# Patient Record
Sex: Male | Born: 1995 | Race: White | Hispanic: No | Marital: Single | Smoking: Never smoker
Health system: Southern US, Community
[De-identification: ages and names within clinical notes are randomized; demographics above are authoritative.]

## PROBLEM LIST (undated history)

## (undated) DIAGNOSIS — E669 Obesity, unspecified: Secondary | ICD-10-CM

## (undated) HISTORY — PX: KNEE SURGERY: SHX244

## (undated) HISTORY — PX: TYMPANOSTOMY TUBE PLACEMENT: SHX32

## (undated) HISTORY — PX: TONSILLECTOMY: SUR1361

---

## 2015-05-01 ENCOUNTER — Emergency Department
Admission: EM | Admit: 2015-05-01 | Discharge: 2015-05-01 | Disposition: A | Discharge status: Home / Self Care | Payer: 59 | Attending: Emergency Medicine | Admitting: Emergency Medicine

## 2015-05-01 ENCOUNTER — Encounter: Payer: Self-pay | Admitting: *Deleted

## 2015-05-01 DIAGNOSIS — R197 Diarrhea, unspecified: Secondary | ICD-10-CM | POA: Diagnosis not present

## 2015-05-01 DIAGNOSIS — R112 Nausea with vomiting, unspecified: Secondary | ICD-10-CM | POA: Insufficient documentation

## 2015-05-01 HISTORY — DX: Obesity, unspecified: E66.9

## 2015-05-01 LAB — URINALYSIS COMPLETE WITH MICROSCOPIC (ARMC ONLY)
BACTERIA UA: NONE SEEN
BILIRUBIN URINE: NEGATIVE
GLUCOSE, UA: NEGATIVE mg/dL
HGB URINE DIPSTICK: NEGATIVE
Ketones, ur: NEGATIVE mg/dL
LEUKOCYTES UA: NEGATIVE
NITRITE: NEGATIVE
Protein, ur: NEGATIVE mg/dL
Specific Gravity, Urine: 1.021 (ref 1.005–1.030)
pH: 6 (ref 5.0–8.0)

## 2015-05-01 MED ORDER — ONDANSETRON 4 MG PO TBDP
ORAL_TABLET | ORAL | Status: AC
  Filled 2015-05-01: qty 1

## 2015-05-01 MED ORDER — ONDANSETRON HCL 4 MG PO TABS: 4.0000 mg | ORAL_TABLET | Freq: Every day | ORAL | Status: AC | PRN

## 2015-05-01 MED ORDER — ONDANSETRON 4 MG PO TBDP
4.0000 mg | ORAL_TABLET | Freq: Once | ORAL | Status: AC
  Administered 2015-05-01: 4 mg via ORAL

## 2015-05-01 NOTE — ED Notes (Signed)
Pt here with vomiting and diarrhea since 1am. Significant other has same symptoms and was told that its a stomach bug.  Pt has been able to tolerate some fluids.

## 2015-05-01 NOTE — Discharge Instructions (Signed)
Take Zofran, ondansetron, if needed for further nausea. He keeps slipping on fluids. Eat small meals for the next day of healthy simple foods. Avoid meat and grease. Follow-up with [facility] clinic or another doctor if you have ongoing problems. If he can't keep fluids down or if you have abdominal pain or other urgent concerns, return to the emergency department.  Nausea and Vomiting Nausea is a sick feeling that often comes before throwing up (vomiting). Vomiting is a reflex where stomach contents come out of your mouth. Vomiting can cause severe loss of body fluids (dehydration). Children and elderly adults can become dehydrated quickly, especially if they also have diarrhea. Nausea and vomiting are symptoms of a condition or disease. It is important to find the cause of your symptoms. CAUSES   Direct irritation of the stomach lining. This irritation can result from increased acid production (gastroesophageal reflux disease), infection, food poisoning, taking certain medicines (such as nonsteroidal anti-inflammatory drugs), alcohol use, or tobacco use.  Signals from the brain.These signals could be caused by a headache, heat exposure, an inner ear disturbance, increased pressure in the brain from injury, infection, a tumor, or a concussion, pain, emotional stimulus, or metabolic problems.  An obstruction in the gastrointestinal tract (bowel obstruction).  Illnesses such as diabetes, hepatitis, gallbladder problems, appendicitis, kidney problems, cancer, sepsis, atypical symptoms of a heart attack, or eating disorders.  Medical treatments such as chemotherapy and radiation.  Receiving medicine that makes you sleep (general anesthetic) during surgery. DIAGNOSIS Your caregiver may ask for tests to be done if the problems do not improve after a few days. Tests may also be done if symptoms are severe or if the reason for the nausea and vomiting is not clear. Tests may include:  Urine  tests.  Blood tests.  Stool tests.  Cultures (to look for evidence of infection).  X-rays or other imaging studies. Test results can help your caregiver make decisions about treatment or the need for additional tests. TREATMENT You need to stay well hydrated. Drink frequently but in small amounts.You may wish to drink water, sports drinks, clear broth, or eat frozen ice pops or gelatin dessert to help stay hydrated.When you eat, eating slowly may help prevent nausea.There are also some antinausea medicines that may help prevent nausea. HOME CARE INSTRUCTIONS   Take all medicine as directed by your caregiver.  If you do not have an appetite, do not force yourself to eat. However, you must continue to drink fluids.  If you have an appetite, eat a normal diet unless your caregiver tells you differently.  Eat a variety of complex carbohydrates (rice, wheat, potatoes, bread), lean meats, yogurt, fruits, and vegetables.  Avoid high-fat foods because they are more difficult to digest.  Drink enough water and fluids to keep your urine clear or pale yellow.  If you are dehydrated, ask your caregiver for specific rehydration instructions. Signs of dehydration may include:  Severe thirst.  Dry lips and mouth.  Dizziness.  Dark urine.  Decreasing urine frequency and amount.  Confusion.  Rapid breathing or pulse. SEEK IMMEDIATE MEDICAL CARE IF:   You have blood or brown flecks (like coffee grounds) in your vomit.  You have black or bloody stools.  You have a severe headache or stiff neck.  You are confused.  You have severe abdominal pain.  You have chest pain or trouble breathing.  You do not urinate at least once every 8 hours.  You develop cold or clammy skin.  You continue  to vomit for longer than 24 to 48 hours.  You have a fever. MAKE SURE YOU:   Understand these instructions.  Will watch your condition.  Will get help right away if you are not doing  well or get worse. Document Released: 10/25/2005 Document Revised: 01/17/2012 Document Reviewed: 03/24/2011 [facility] Patient Information 2015 [place], [place]. This information is not intended to replace advice given to you by your health care provider. Make sure you discuss any questions you have with your health care provider.

## 2015-05-01 NOTE — ED Provider Notes (Signed)
[facility] Emergency Department Provider Note  ____________________________________________  Time seen: 2125 I have reviewed the triage vital signs and the nursing notes.   HISTORY  Chief Complaint Emesis  diarrhea    HPI Maurice Woods is a 19 y.o. male   who was at work last night when he began feeling nauseous and went to the bathroom and vomited 4:15 to 20 minutes. He has had diarrhea as well. His significant other, who is pregnant, is having similar symptoms that began at the same time.   [name] continued to have nausea and vomiting today but he has been able to drink and hold down fluids. He reports the last time he vomited was around 2:00pm. He denies abdominal pain except for the mild discomfort that often comes with emesis. He has no fever and no shortness of breath.    Past Medical History  Diagnosis Date  . Obesity     There are no active problems to display for this patient.   Past Surgical History  Procedure Laterality Date  . Knee surgery    . Tonsillectomy      Current Outpatient Rx  Name  Route  Sig  Dispense  Refill  . ondansetron (ZOFRAN) 4 MG tablet   Oral   Take 1 tablet (4 mg total) by mouth daily as needed for nausea or vomiting.   10 tablet   0     Allergies Review of patient's allergies indicates no known allergies.  No family history on file.  Social History History  Substance Use Topics  . Smoking status: Never Smoker   . Smokeless tobacco: Not on file  . Alcohol Use: No    Review of Systems  Constitutional: Negative for fever. ENT: Negative for sore throat. Cardiovascular: Negative for chest pain. Respiratory: Negative for shortness of breath. Gastrointestinal: Nausea vomiting diarrhea. See history of present illness Genitourinary: Negative for dysuria. Musculoskeletal: No myalgias or injuries. Skin: Negative for rash. Neurological: Negative for headaches   10-point ROS otherwise  negative.  ____________________________________________   PHYSICAL EXAM:  VITAL SIGNS: ED Triage Vitals  Enc Vitals Group     BP 05/01/15 2019 134/66 mmHg     Pulse Rate 05/01/15 2019 108     Resp 05/01/15 2019 20     Temp 05/01/15 2019 99 F (37.2 C)     Temp Source 05/01/15 2019 Oral     SpO2 05/01/15 2019 96 %     Weight 05/01/15 2019 325 lb (147.419 kg)     Height 05/01/15 2019 6\' 3"  (1.905 m)     Head Cir --      Peak Flow --      Pain Score 05/01/15 2020 6     Pain Loc --      Pain Edu? --      Excl. in GC? --     Constitutional: Alert and oriented. Well appearing and in no distress. ENT   Head: Normocephalic and atraumatic.   Nose: No congestion/rhinnorhea.   Mouth/Throat: Mucous membranes are moist. Cardiovascular: Normal rate at 90, regular rhythm, no murmur noted Respiratory:  Normal respiratory effort, no tachypnea.    Breath sounds are clear and equal bilaterally.  Gastrointestinal: Soft and nontender - no focal pain. No distention.  Back: No muscle spasm, no tenderness, no CVA tenderness. Musculoskeletal: No deformity noted. Nontender with normal range of motion in all extremities.  No noted edema. Neurologic:  Normal speech and language. No gross focal neurologic deficits are appreciated.  Skin:  Skin is warm, dry. No rash noted. Psychiatric: Mood and affect are normal. Speech and behavior are normal.  ____________________________________________    LABS (pertinent positives/negatives)  Urinalysis: Negative for infection. No ketones.  ____________________________________________ ____________________________________________   INITIAL IMPRESSION / ASSESSMENT AND PLAN / ED COURSE  Well-appearing 19 year old male with nausea vomiting diarrhea. He has not vomited in a couple of hours. He says he has minimal nausea at this time. We will treat him with Zofran ODT and began an oral challenge. If he is able to tolerate fluids we'll let him go home  with a prescription for additional Zofran.  ____________________________________________   FINAL CLINICAL IMPRESSION(S) / ED DIAGNOSES  Final diagnoses:  Nausea vomiting and diarrhea      Darien Ramus, MD 05/01/15 [phone]

## 2016-01-14 ENCOUNTER — Emergency Department
Admission: EM | Admit: 2016-01-14 | Discharge: 2016-01-14 | Disposition: A | Discharge status: Home / Self Care | Payer: Self-pay | Attending: Emergency Medicine | Admitting: Emergency Medicine

## 2016-01-14 DIAGNOSIS — J069 Acute upper respiratory infection, unspecified: Secondary | ICD-10-CM | POA: Insufficient documentation

## 2016-01-14 DIAGNOSIS — H65191 Other acute nonsuppurative otitis media, right ear: Secondary | ICD-10-CM | POA: Insufficient documentation

## 2016-01-14 MED ORDER — AMOXICILLIN 500 MG PO CAPS: 500.0000 mg | ORAL_CAPSULE | Freq: Two times a day (BID) | ORAL | Status: AC

## 2016-01-14 NOTE — ED Provider Notes (Signed)
[facility] Emergency Department Provider Note  ____________________________________________  Time seen: Approximately 8:44 PM  I have reviewed the triage vital signs and the nursing notes.   HISTORY  Chief Complaint Otalgia    HPI Maurice Woods is a 20 y.o. male with a history of recurrent otitis media as a child, presenting with right ear pain in the setting of a nonproductive cough, congestion and rhinorrhea. No sore throat, shortness of breath. Positive muffled sounds. Patient has tried putting peroxide in his ear without any improvement.   Past Medical History  Diagnosis Date  . Obesity     There are no active problems to display for this patient.   Past Surgical History  Procedure Laterality Date  . Knee surgery    . Tonsillectomy    . Tympanostomy tube placement      Current Outpatient Rx  Name  Route  Sig  Dispense  Refill  . ondansetron (ZOFRAN) 4 MG tablet   Oral   Take 1 tablet (4 mg total) by mouth daily as needed for nausea or vomiting.   10 tablet   0     Allergies Review of patient's allergies indicates no known allergies.  History reviewed. No pertinent family history.  Social History Social History  Substance Use Topics  . Smoking status: Never Smoker   . Smokeless tobacco: None  . Alcohol Use: No    Review of Systems Constitutional: No fever/chills. No lightheadedness or syncopal. Eyes: No visual changes. No eye discharge. ENT: No sore throat. Positive congestion and rhinorrhea. Positive right ear pain with muffled sounds. Cardiovascular: Denies chest pain, palpitations. Respiratory: Denies shortness of breath.  Positive cough. Gastrointestinal: No abdominal pain.  No nausea, no vomiting.  No diarrhea.  No constipation. Genitourinary: Negative for dysuria. Musculoskeletal: Negative for back pain. Skin: Negative for rash. Neurological: Negative for headaches, focal weakness or numbness.  10-point ROS  otherwise negative.  ____________________________________________   PHYSICAL EXAM:  VITAL SIGNS: ED Triage Vitals  Enc Vitals Group     BP 01/14/16 2016 150/81 mmHg     Pulse Rate 01/14/16 2016 88     Resp 01/14/16 2016 17     Temp 01/14/16 2016 97.9 F (36.6 C)     Temp Source 01/14/16 2016 Oral     SpO2 01/14/16 2016 98 %     Weight 01/14/16 2016 325 lb (147.419 kg)     Height 01/14/16 2016  (1.88 m)     Head Cir --      Peak Flow --      Pain Score 01/14/16 2017 9     Pain Loc --      Pain Edu? --      Excl. in GC? --     Constitutional: Alert and oriented. Well appearing and in no acute distress. Answer question appropriately. Eyes: Conjunctivae are normal.  EOMI. no eye discharge. EARS: Left TM is clear without bulge, erythema or fluid. Right TM has diffuse erythema with fluid behind the ear. No evidence of perforation. Canals are clear bilaterally. Head: Atraumatic. Nose: Positive congestion without rhinorrhea. Mouth/Throat: Mucous membranes are moist.  Neck: No stridor.  Supple.  No submandibular lymphadenopathy. Cardiovascular: Normal rate, regular rhythm. No murmurs, rubs or gallops.  Respiratory: Normal respiratory effort.  No retractions. Lungs CTAB.  No wheezes, rales or ronchi. Musculoskeletal: No LE edema.  Neurologic:  Normal speech and language. No gross focal neurologic deficits are appreciated.  Skin:  Skin is warm, dry and  intact. No rash noted. Psychiatric: Mood and affect are normal. Speech and behavior are normal.  Normal judgement.  ____________________________________________   LABS (all labs ordered are listed, but only abnormal results are displayed)  Labs Reviewed - No data to display ____________________________________________  EKG  Not indicated ____________________________________________  RADIOLOGY  No results found.  ____________________________________________   PROCEDURES  Procedure(s) performed: None  Critical  Care performed: No ____________________________________________   INITIAL IMPRESSION / ASSESSMENT AND PLAN / ED COURSE  Pertinent labs & imaging results that were available during my care of the patient were reviewed by me and considered in my medical decision making (see chart for details).  20 y.o. male with URI symptoms and right ear pain, findings consistent with otitis media on the right. Will plan to discharge him home with antibiotics and have him follow-up with his primary care physician. Return precautions were discussed.  ____________________________________________  FINAL CLINICAL IMPRESSION(S) / ED DIAGNOSES  Final diagnoses:  Other acute nonsuppurative otitis media of right ear  URI, acute      NEW MEDICATIONS STARTED DURING THIS VISIT:  New Prescriptions   No medications on file     Rockne MenghiniAnne-Caroline Elisandro Jarrett, MD 01/14/16 2048

## 2016-01-14 NOTE — Discharge Instructions (Signed)
Please return to the emergency department if you develop worsening pain, fever, inability to keep down fluids, shortness breath, or any other symptoms concerning to you.

## 2016-01-14 NOTE — ED Notes (Signed)
Pt drove self

## 2016-01-14 NOTE — ED Notes (Deleted)
Pt reports right flank pain since Friday. Pt denies abdominal pain or dysuria

## 2016-01-14 NOTE — ED Notes (Signed)
Pt arrived to ED with c/o right ear pain that began on Sunday. Pt reports increased pain today at 4 pm.

## 2016-01-25 ENCOUNTER — Emergency Department: Payer: Self-pay

## 2016-01-25 DIAGNOSIS — H6693 Otitis media, unspecified, bilateral: Secondary | ICD-10-CM | POA: Insufficient documentation

## 2016-01-25 DIAGNOSIS — J209 Acute bronchitis, unspecified: Secondary | ICD-10-CM | POA: Insufficient documentation

## 2016-01-25 LAB — COMPREHENSIVE METABOLIC PANEL
ALBUMIN: 4.2 g/dL (ref 3.5–5.0)
ALK PHOS: 125 U/L (ref 38–126)
ALT: 62 U/L (ref 17–63)
AST: 37 U/L (ref 15–41)
Anion gap: 5 (ref 5–15)
BILIRUBIN TOTAL: 0.8 mg/dL (ref 0.3–1.2)
BUN: 15 mg/dL (ref 6–20)
CALCIUM: 9.3 mg/dL (ref 8.9–10.3)
CO2: 26 mmol/L (ref 22–32)
Chloride: 104 mmol/L (ref 101–111)
Creatinine, Ser: 1.3 mg/dL — ABNORMAL HIGH (ref 0.61–1.24)
GFR calc Af Amer: >60 mL/min (ref >60)
Glucose, Bld: 117 mg/dL — ABNORMAL HIGH (ref 65–99)
Potassium: 4.4 mmol/L (ref 3.5–5.1)
Sodium: 135 mmol/L (ref 135–145)
TOTAL PROTEIN: 8.1 g/dL (ref 6.5–8.1)

## 2016-01-25 LAB — CBC
HEMATOCRIT: 50.6 % (ref 40.0–52.0)
HEMOGLOBIN: 17.2 g/dL (ref 13.0–18.0)
MCH: 29.7 pg (ref 26.0–34.0)
MCHC: 34.1 g/dL (ref 32.0–36.0)
MCV: 87 fL (ref 80.0–100.0)
Platelets: 281 10*3/uL (ref 150–440)
RBC: 5.81 MIL/uL (ref 4.40–5.90)
RDW: 12.8 % (ref 11.5–14.5)
WBC: 11.8 10*3/uL — AB (ref 3.8–10.6)

## 2016-01-25 LAB — TROPONIN I: Troponin I: <0.03 ng/mL (ref <0.031)

## 2016-01-25 MED ORDER — IPRATROPIUM-ALBUTEROL 0.5-2.5 (3) MG/3ML IN SOLN
RESPIRATORY_TRACT | Status: AC
  Filled 2016-01-25: qty 3

## 2016-01-25 MED ORDER — IPRATROPIUM-ALBUTEROL 0.5-2.5 (3) MG/3ML IN SOLN
3.0000 mL | Freq: Once | RESPIRATORY_TRACT | Status: AC
  Administered 2016-01-25: 3 mL via RESPIRATORY_TRACT

## 2016-01-25 NOTE — ED Notes (Signed)
Pt states that he started feeling bad on Monday, states cough, states that he has also been having a headache, pt states that he woke up to get ready for work and started coughing a lot, pt states that he felt like he was going to pass out and states he started having a sharp stabbing pain in the center of his chest, pt reports asthma as a child, last episode approx 10 years ago

## 2016-01-26 ENCOUNTER — Emergency Department
Admission: EM | Admit: 2016-01-26 | Discharge: 2016-01-26 | Disposition: A | Discharge status: Home / Self Care | Payer: Self-pay | Attending: Emergency Medicine | Admitting: Emergency Medicine

## 2016-01-26 DIAGNOSIS — J40 Bronchitis, not specified as acute or chronic: Secondary | ICD-10-CM

## 2016-01-26 DIAGNOSIS — R05 Cough: Secondary | ICD-10-CM

## 2016-01-26 DIAGNOSIS — H6693 Otitis media, unspecified, bilateral: Secondary | ICD-10-CM

## 2016-01-26 DIAGNOSIS — R059 Cough, unspecified: Secondary | ICD-10-CM

## 2016-01-26 MED ORDER — LORATADINE 10 MG PO TABS: 10.0000 mg | ORAL_TABLET | Freq: Every day | ORAL | Status: AC | PRN

## 2016-01-26 MED ORDER — AMOXICILLIN-POT CLAVULANATE 875-125 MG PO TABS: 1.0000 | ORAL_TABLET | Freq: Two times a day (BID) | ORAL | Status: AC

## 2016-01-26 MED ORDER — AMOXICILLIN-POT CLAVULANATE 875-125 MG PO TABS
1.0000 | ORAL_TABLET | Freq: Once | ORAL | Status: AC
  Administered 2016-01-26: 1 via ORAL
  Filled 2016-01-26: qty 1

## 2016-01-26 MED ORDER — ALBUTEROL SULFATE HFA 108 (90 BASE) MCG/ACT IN AERS: 2.0000 | INHALATION_SPRAY | Freq: Four times a day (QID) | RESPIRATORY_TRACT | Status: AC | PRN

## 2016-01-26 MED ORDER — AZITHROMYCIN 500 MG PO TABS
500.0000 mg | ORAL_TABLET | Freq: Once | ORAL | Status: AC
  Administered 2016-01-26: 500 mg via ORAL
  Filled 2016-01-26: qty 1

## 2016-01-26 MED ORDER — IPRATROPIUM-ALBUTEROL 0.5-2.5 (3) MG/3ML IN SOLN
3.0000 mL | Freq: Once | RESPIRATORY_TRACT | Status: AC
  Administered 2016-01-26: 3 mL via RESPIRATORY_TRACT
  Filled 2016-01-26: qty 3

## 2016-01-26 NOTE — Discharge Instructions (Signed)
Return to the emergency room for shortness of breath or other new or worrisome symptoms including increased pain around your ears. Fever stiff neck or you feel worse in any way, follow closely with ENT for your ear infections or they could get significantly worse. Allergies An allergy is an abnormal reaction to a substance by the body's defense system (immune system). Allergies can develop at any age. WHAT CAUSES ALLERGIES? An allergic reaction happens when the immune system mistakenly reacts to a normally harmless substance, called an allergen, as if it were harmful. The immune system releases antibodies to fight the substance. Antibodies eventually release a chemical called histamine into the bloodstream. The release of histamine is meant to protect the body from infection, but it also causes discomfort. An allergic reaction can be triggered by:  Eating an allergen.  Inhaling an allergen.  Touching an allergen. WHAT TYPES OF ALLERGIES ARE THERE? There are many types of allergies. Common types include:  Seasonal allergies. People with this type of allergy are usually allergic to substances that are only present during certain seasons, such as molds and pollens.  Food allergies.  Drug allergies.  Insect allergies.  Animal dander allergies. WHAT ARE SYMPTOMS OF ALLERGIES? Possible allergy symptoms include:  Swelling of the lips, face, tongue, mouth, or throat.  Sneezing, coughing, or wheezing.  Nasal congestion.  Tingling in the mouth.  Rash.  Itching.  Itchy, red, swollen areas of skin (hives).  Watery eyes.  Vomiting.  Diarrhea.  Dizziness.  Lightheadedness.  Fainting.  Trouble breathing or swallowing.  Chest tightness.  Rapid heartbeat. HOW ARE ALLERGIES DIAGNOSED? Allergies are diagnosed with a medical and family history and one or more of the following:  Skin tests.  Blood tests.  A food diary. A food diary is a record of all the foods and drinks  you have in a day and of all the symptoms you experience.  The results of an elimination diet. An elimination diet involves eliminating foods from your diet and then adding them back in one by one to find out if a certain food causes an allergic reaction. HOW ARE ALLERGIES TREATED? There is no cure for allergies, but allergic reactions can be treated with medicine. Severe reactions usually need to be treated at a hospital. HOW CAN REACTIONS BE PREVENTED? The best way to prevent an allergic reaction is by avoiding the substance you are allergic to. Allergy shots and medicines can also help prevent reactions in some cases. People with severe allergic reactions may be able to prevent a life-threatening reaction called anaphylaxis with a medicine given right after exposure to the allergen.   This information is not intended to replace advice given to you by your health care provider. Make sure you discuss any questions you have with your health care provider.   Document Released: 01/18/2003 Document Revised: 11/15/2014 Document Reviewed: 08/06/2014 Elsevier Interactive Patient Education 2016 [employer]  Cough, Adult Coughing is a reflex that clears your throat and your airways. Coughing helps to heal and protect your lungs. It is normal to cough occasionally, but a cough that happens with other symptoms or lasts a long time may be a sign of a condition that needs treatment. A cough may last only 2-3 weeks (acute), or it may last longer than 8 weeks (chronic). CAUSES Coughing is commonly caused by:  Breathing in substances that irritate your lungs.  A viral or bacterial respiratory infection.  Allergies.  Asthma.  Postnasal drip.  Smoking.  Acid backing up  from the stomach into the esophagus (gastroesophageal reflux).  Certain medicines.  Chronic lung problems, including COPD (or rarely, lung cancer).  Other medical conditions such as heart failure. HOME CARE INSTRUCTIONS  Pay  attention to any changes in your symptoms. Take these actions to help with your discomfort:  Take medicines only as told by your health care provider.  If you were prescribed an antibiotic medicine, take it as told by your health care provider. Do not stop taking the antibiotic even if you start to feel better.  Talk with your health care provider before you take a cough suppressant medicine.  Drink enough fluid to keep your urine clear or pale yellow.  If the air is dry, use a cold steam vaporizer or humidifier in your bedroom or your home to help loosen secretions.  Avoid anything that causes you to cough at work or at home.  If your cough is worse at night, try sleeping in a semi-upright position.  Avoid cigarette smoke. If you smoke, quit smoking. If you need help quitting, ask your health care provider.  Avoid caffeine.  Avoid alcohol.  Rest as needed. SEEK MEDICAL CARE IF:   You have new symptoms.  You cough up pus.  Your cough does not get better after 2-3 weeks, or your cough gets worse.  You cannot control your cough with suppressant medicines and you are losing sleep.  You develop pain that is getting worse or pain that is not controlled with pain medicines.  You have a fever.  You have unexplained weight loss.  You have night sweats. SEEK IMMEDIATE MEDICAL CARE IF:  You cough up blood.  You have difficulty breathing.  Your heartbeat is very fast.   This information is not intended to replace advice given to you by your health care provider. Make sure you discuss any questions you have with your health care provider.   Document Released: 04/23/2011 Document Revised: 07/16/2015 Document Reviewed: 01/01/2015 Elsevier Interactive Patient Education [employer]

## 2016-01-26 NOTE — ED Provider Notes (Addendum)
[facility] Emergency Department Provider Note  ____________________________________________   I have reviewed the triage vital signs and the nursing notes.   HISTORY  Chief Complaint Cough    HPI Maurice Woods is a 20 y.o. male who was recently treated for an otitis media. Does have a history of recurrent otitis media. He was given amoxicillin however he states he did not "take them all". He is unsure how many he took. "Maybe a week or maybe less.". In any event, he has developeda cough, he states his ears are still somewhat clogged but now he is complaining of a cough which is significant. His of seasonal allergies as not taking anything for it. He used to have asthma but no longer takes any inhalers. He is here now because of the cough. He did receive an albuterol neb. Although there are low oxygen saturations noted in the triage note, when I'm in the room is 98-90%. He states he feels much better after a single nebulizer. He has not had any fever, he does not have any increased ear pain. He states he still feels that there is fluid behind his ears however.  Past Medical History  Diagnosis Date  . Obesity     There are no active problems to display for this patient.   Past Surgical History  Procedure Laterality Date  . Knee surgery    . Tonsillectomy    . Tympanostomy tube placement      Current Outpatient Rx  Name  Route  Sig  Dispense  Refill  . amoxicillin (AMOXIL) 500 MG capsule   Oral   Take 1 capsule (500 mg total) by mouth 2 (two) times daily.   20 capsule   0   . ondansetron (ZOFRAN) 4 MG tablet   Oral   Take 1 tablet (4 mg total) by mouth daily as needed for nausea or vomiting. Patient not taking: Reported on 01/25/2016   10 tablet   0     Allergies Review of patient's allergies indicates no known allergies.  No family history on file.  Social History Social History  Substance Use Topics  . Smoking status: Never  Smoker   . Smokeless tobacco: Not on file  . Alcohol Use: No    Review of Systems Constitutional: No fever/chills Eyes: No visual changes. ENT: No sore throat. No stiff neck no neck pain Cardiovascular: Denies chest pain. Respiratory: Positive shortness of breath. Gastrointestinal:   no vomiting.  No diarrhea.  No constipation. Genitourinary: Negative for dysuria. Musculoskeletal: Negative lower extremity swelling Skin: Negative for rash. Neurological: Negative for headaches, focal weakness or numbness. 10-point ROS otherwise negative.  ____________________________________________   PHYSICAL EXAM:  VITAL SIGNS: ED Triage Vitals  Enc Vitals Group     BP 01/25/16 2255 134/95 mmHg     Pulse Rate 01/25/16 2255 108     Resp 01/25/16 2255 20     Temp 01/25/16 2255 98.1 F (36.7 C)     Temp Source 01/25/16 2255 Oral     SpO2 01/25/16 2255 92 %     Weight 01/25/16 2255 325 lb (147.419 kg)     Height 01/25/16 2255  (1.88 m)     Head Cir --      Peak Flow --      Pain Score 01/25/16 2256 7     Pain Loc --      Pain Edu? --      Excl. in GC? --  Constitutional: Alert and oriented. Well appearing and in no acute distress. He is using telephone with his legs crossed Eyes: Conjunctivae are normal. PERRL. EOMI. Head: Atraumatic. Nose: Positive mild congestion/rhinnorhea. Both eardrums are erythematous with fluid behind them no evidence of perforation or mastoiditis Mouth/Throat: Mucous membranes are moist.  Oropharynx non-erythematous. Neck: No stridor.   Nontender with no meningismus Cardiovascular: Normal rate, regular rhythm. Grossly normal heart sounds.  Good peripheral circulation. Respiratory: Normal respiratory effort.  No retractions. Lungs CTAB. Abdominal: Soft and nontender. No distention. No guarding no rebound Back:  There is no focal tenderness or step off there is no midline tenderness there are no lesions noted. there is no CVA tenderness Musculoskeletal:  No lower extremity tenderness. No joint effusions, no DVT signs strong distal pulses no edema Neurologic:  Normal speech and language. No gross focal neurologic deficits are appreciated.  Skin:  Skin is warm, dry and intact. No rash noted. Psychiatric: Mood and affect are normal. Speech and behavior are normal.  ____________________________________________   LABS (all labs ordered are listed, but only abnormal results are displayed)  Labs Reviewed  CBC - Abnormal; Notable for the following:    WBC 11.8 (*)    All other components within normal limits  COMPREHENSIVE METABOLIC PANEL - Abnormal; Notable for the following:    Glucose, Bld 117 (*)    Creatinine, Ser 1.30 (*)    All other components within normal limits  TROPONIN I   ____________________________________________  EKG  I personally interpreted any EKGs ordered by me or triage  ____________________________________________  RADIOLOGY  I reviewed any imaging ordered by me or triage that were performed during my shift and, if possible, patient and/or family made aware of any abnormal findings. ____________________________________________   PROCEDURES  Procedure(s) performed: None  Critical Care performed: None  ____________________________________________   INITIAL IMPRESSION / ASSESSMENT AND PLAN / ED COURSE  Pertinent labs & imaging results that were available during my care of the patient were reviewed by me and considered in my medical decision making (see chart for details).  Patient states he feels almost better like another neb before he goes home. He does have a recurrent otitis media. Is noncompliant with the antibiotic. Given ongoing ear infection and will avoid steroids as I do not think they're indicated. I will restart him on Augmentin. This will be for his ears not for his likely viral cough. We will refer him to ENT. I strongly encouraged him to take all the antibiotics this time. His oxygen  saturation like I said is documented 92-93 but when I'm in the room and get a good waveform as opposed to what appears to be documented, the patient's sats are 98-99.  ____________________________________________   FINAL CLINICAL IMPRESSION(S) / ED DIAGNOSES  Final diagnoses:  None      This chart was dictated using voice recognition software.  Despite best efforts to proofread,  errors can occur which can change meaning.      Jeanmarie PlantJames A Agron Swiney, MD 01/26/16 0157  Jeanmarie PlantJames A Hebert Dooling, MD 01/26/16 [phone]

## 2016-01-26 NOTE — ED Notes (Signed)
Pt states "days" of cough, chest tightness with coughing, feeling shob with coughing. Pt with hoarse voice noted. Skin pwd, resps unlabored. Pt states "i feel a little better" after duoneb. Pt states "i still don't feel all better."

## 2017-04-09 IMAGING — CR DG CHEST 2V
1 series · 2 of 2 positions shown · non-contrast
Comparison: None.

CLINICAL DATA: Cough and dyspnea, onset this evening.

EXAM:
CHEST  2 VIEW

[Series 1: dg chest 2 view · 0.14mm/px · 2 of 2 slices shown]
[im 1/2]
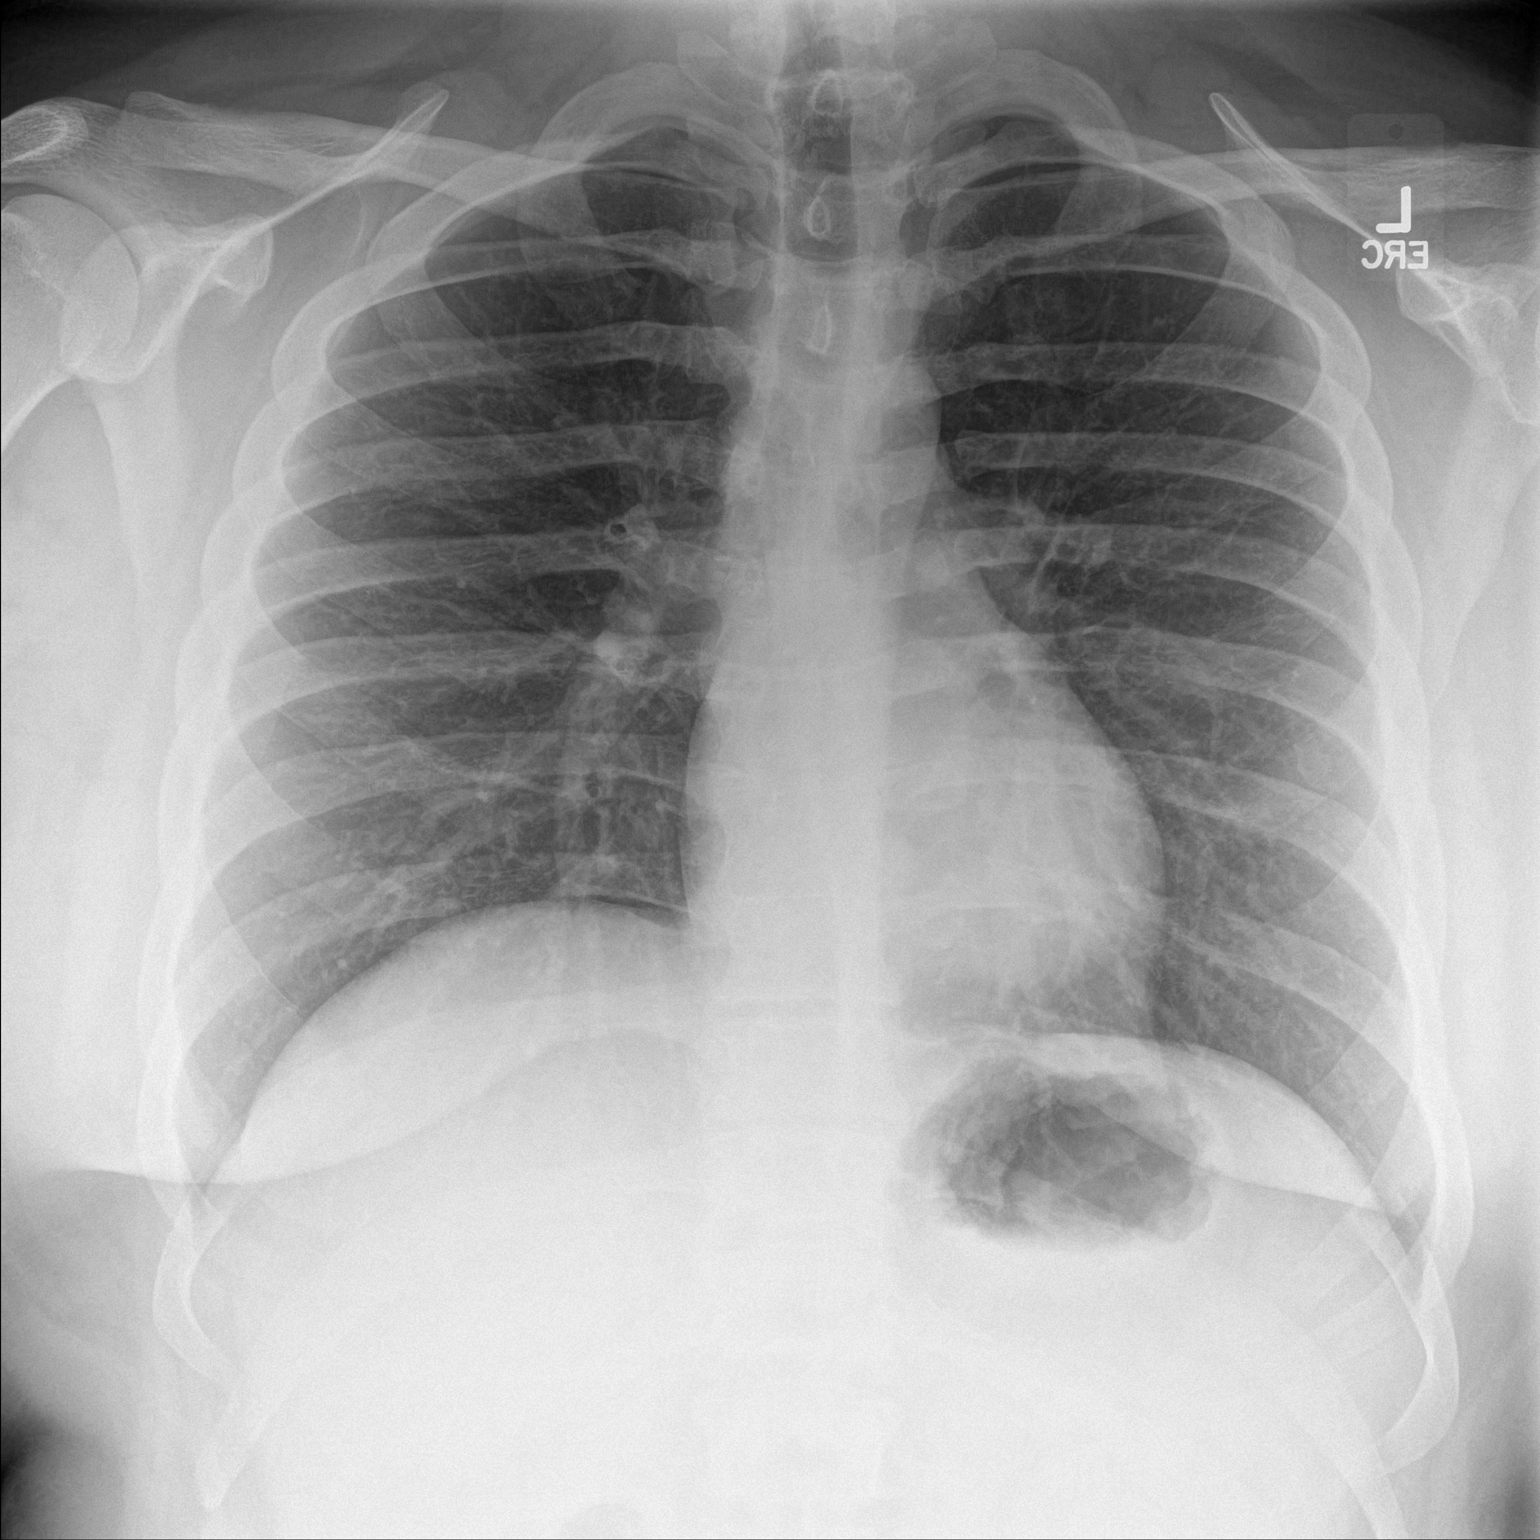
[im 2/2]
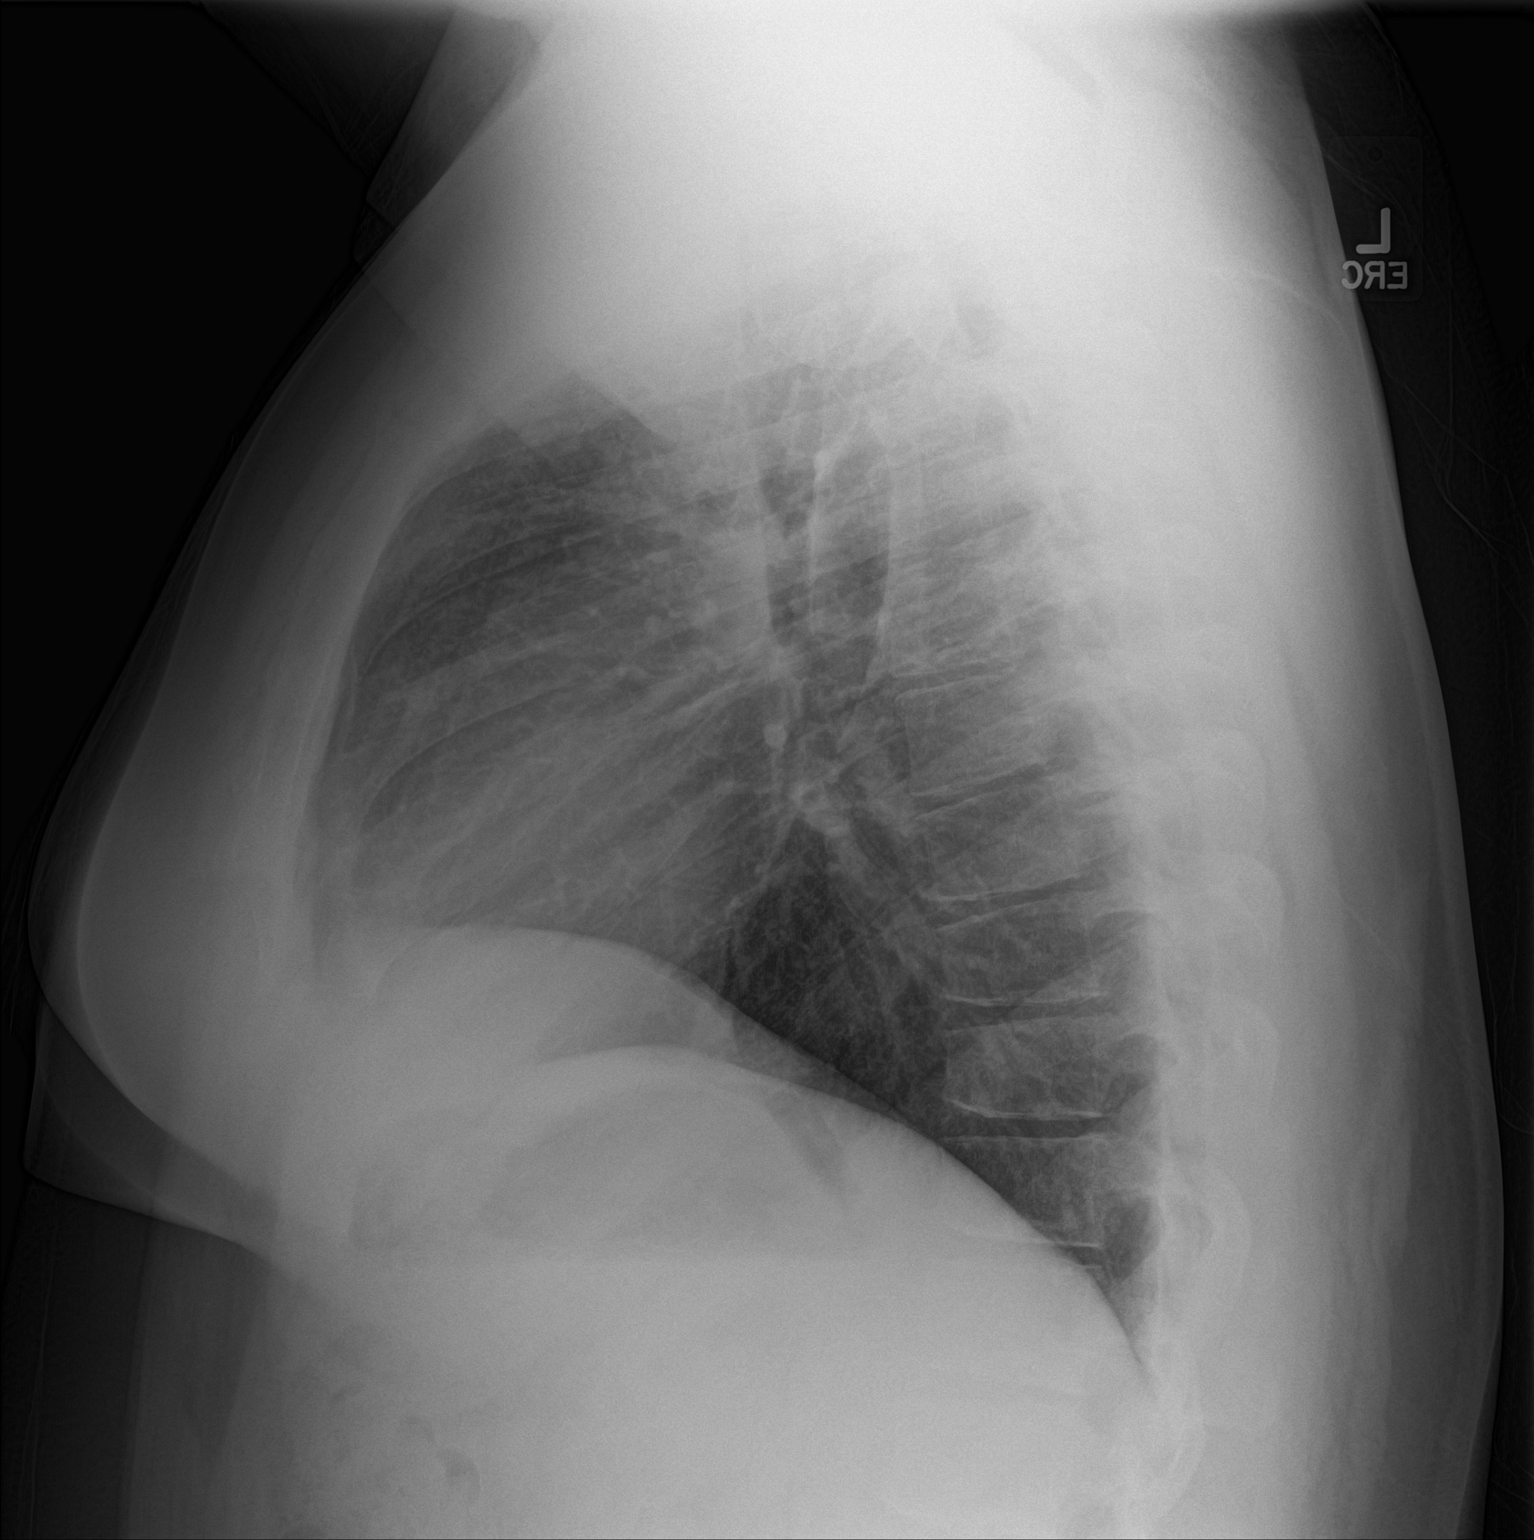

[2 of 2 positions shown; findings below may reference images not displayed]

FINDINGS: The heart size and mediastinal contours are within normal limits.
Both lungs are clear. The visualized skeletal structures are
unremarkable.
IMPRESSION: No active cardiopulmonary disease.

## 2023-11-09 ENCOUNTER — Other Ambulatory Visit: Payer: Self-pay

## 2023-11-09 ENCOUNTER — Emergency Department
Admission: EM | Admit: 2023-11-09 | Discharge: 2023-11-09 | Disposition: A | Discharge status: Home / Self Care | Payer: BC Managed Care – PPO | Attending: Emergency Medicine | Admitting: Emergency Medicine

## 2023-11-09 DIAGNOSIS — R002 Palpitations: Secondary | ICD-10-CM | POA: Insufficient documentation

## 2023-11-09 DIAGNOSIS — R7989 Other specified abnormal findings of blood chemistry: Secondary | ICD-10-CM | POA: Diagnosis not present

## 2023-11-09 DIAGNOSIS — R0602 Shortness of breath: Secondary | ICD-10-CM | POA: Diagnosis not present

## 2023-11-09 DIAGNOSIS — R000 Tachycardia, unspecified: Secondary | ICD-10-CM | POA: Insufficient documentation

## 2023-11-09 DIAGNOSIS — R0789 Other chest pain: Secondary | ICD-10-CM | POA: Insufficient documentation

## 2023-11-09 LAB — BASIC METABOLIC PANEL
Anion gap: 10 (ref 5–15)
BUN: 14 mg/dL (ref 6–20)
CO2: 24 mmol/L (ref 22–32)
Calcium: 9.4 mg/dL (ref 8.9–10.3)
Chloride: 103 mmol/L (ref 98–111)
Creatinine, Ser: 1.26 mg/dL — ABNORMAL HIGH (ref 0.61–1.24)
GFR, Estimated: >60 mL/min (ref >60)
Glucose, Bld: 99 mg/dL (ref 70–99)
Potassium: 4.7 mmol/L (ref 3.5–5.1)
Sodium: 137 mmol/L (ref 135–145)

## 2023-11-09 LAB — CBC
HCT: 49.8 % (ref 39.0–52.0)
Hemoglobin: 17.2 g/dL — ABNORMAL HIGH (ref 13.0–17.0)
MCH: 30.9 pg (ref 26.0–34.0)
MCHC: 34.5 g/dL (ref 30.0–36.0)
MCV: 89.6 fL (ref 80.0–100.0)
Platelets: 251 10*3/uL (ref 150–400)
RBC: 5.56 MIL/uL (ref 4.22–5.81)
RDW: 12.8 % (ref 11.5–15.5)
WBC: 11.7 10*3/uL — ABNORMAL HIGH (ref 4.0–10.5)
nRBC: 0 % (ref 0.0–0.2)

## 2023-11-09 LAB — TROPONIN I (HIGH SENSITIVITY)
Troponin I (High Sensitivity): 29 ng/L — ABNORMAL HIGH (ref <18)
Troponin I (High Sensitivity): 51 ng/L — ABNORMAL HIGH (ref <18)
Troponin I (High Sensitivity): 41 ng/L — ABNORMAL HIGH (ref <18)

## 2023-11-09 LAB — MAGNESIUM: Magnesium: 2.3 mg/dL (ref 1.7–2.4)

## 2023-11-09 MED ORDER — METOPROLOL SUCCINATE ER 25 MG PO TB24: 12.5000 mg | ORAL_TABLET | Freq: Every day | ORAL | 1 refills | Status: AC

## 2023-11-09 NOTE — ED Triage Notes (Signed)
 EMS brings pt in from local parking lot for c/o racing heart and CP; hx of same with anxiety

## 2023-11-09 NOTE — Discharge Instructions (Addendum)
 Please begin taking her medication as prescribed.  Please follow-up with cardiology but call the number provided to arrange a follow-up appointment.  Return to the emergency department for any further heart racing, any chest pain trouble breathing or any other symptom personally concerning to yourself.

## 2023-11-09 NOTE — ED Provider Notes (Signed)
 [facility] Provider Note    Event Date/Time   First MD Initiated Contact with Patient 11/09/23 0421     (approximate)  History   Chief Complaint: Palpitations  HPI  Maurice Woods is a 28 y.o. male with no significant past medical history who presents to the emergency department for palpitations/heart racing sensation.  According to the patient he has a history of anxiety states that has been largely under control however earlier today he had a sensation of his heart racing and felt short of breath.  Patient states symptoms lasted approximately 30 minutes to up to an hour or so.  Patient had a smart watch on and states his heart rate was around 130 bpm.  He states 2 to 3 years ago he had an episode similar but was told it was likely anxiety induced.  Patient states he was having some chest tightness during the palpitations.  Denies any pain currently.  Denies any symptoms at all.  States he feels back to normal.  Physical Exam   Triage Vital Signs: ED Triage Vitals  Encounter Vitals Group     BP 11/09/23 0028 121/73     Systolic BP Percentile --      Diastolic BP Percentile --      Pulse Rate 11/09/23 0028 98     Resp 11/09/23 0028 18     Temp 11/09/23 0028 98.4 F (36.9 C)     Temp Source 11/09/23 0028 Oral     SpO2 11/09/23 0018 96 %     Weight 11/09/23 0028 (!) 324 lb 15.3 oz (147.4 kg)     Height 11/09/23 0028 6' 2 (1.88 m)     Head Circumference --      Peak Flow --      Pain Score 11/09/23 0027 5     Pain Loc --      Pain Education --      Exclude from Growth Chart --     Most recent vital signs: Vitals:   11/09/23 0028 11/09/23 0233  BP: 121/73 (!) 143/87  Pulse: 98 87  Resp: 18 16  Temp: 98.4 F (36.9 C) 98.1 F (36.7 C)  SpO2: 97% 95%    General: Awake, no distress.  CV:  Good peripheral perfusion.  Regular rate and rhythm  Resp:  Normal effort.  Equal breath sounds bilaterally.  Abd:  No distention.  Soft, nontender.   No rebound or guarding.  ED Results / Procedures / Treatments   EKG  EKG viewed and interpreted by myself shows a normal sinus rhythm at 96 bpm with a narrow QRS, normal axis, normal intervals, no concerning ST changes.  MEDICATIONS ORDERED IN ED: Medications - No data to display   IMPRESSION / MDM / ASSESSMENT AND PLAN / ED COURSE  I reviewed the triage vital signs and the nursing notes.  Patient's presentation is most consistent with acute presentation with potential threat to life or bodily function.  Patient presents to the emergency department for palpitations/heart racing and shortness of breath.  Symptoms lasted approximately 30 minutes up to an hour or so and now have resolved.  Patient states his smart watch showed pulse rate of 130 bpm during the episode.  Patient denies any recent illnesses denies any fever cough congestion.  Patient denies any symptoms currently.  Vital signs reassuring besides slight hypertension. Patient's labs however show a reassuring CBC, reassuring chemistry.  Troponin is slightly elevated at 29.  On recheck patient's troponin  elevated to 51.  A third troponin was sent 2 hours later which then declined back to 41.  Given the patient's elevated troponin I spoke to Dr. Florencio of cardiology.  Recommends starting a low-dose beta-blocker, we will have the patient follow-up in the office.  Patient is agreeable to this plan.  Remains symptom-free.  Patient is agreeable to this plan of care.  Discussed return precautions for any further heart racing/palpitations or chest pain.  FINAL CLINICAL IMPRESSION(S) / ED DIAGNOSES   Palpitations Elevated troponin  Rx / DC Orders   Toprol  12.5 mg daily  Note:  This document was prepared using Dragon voice recognition software and may include unintentional dictation errors.   Dorothyann Drivers, MD 11/09/23 [phone]

## 2023-11-09 NOTE — ED Triage Notes (Signed)
 See First Nurse note; Patient in triage states palpitations happened around 2300 last nigh and continued. States this happened approxc 2 years ago and diagnosed with anxiety. Endorses feeling a little bit better in triage.
